# Patient Record
Sex: Male | Born: 2005 | Race: White | Hispanic: No | Marital: Single | Smoking: Never smoker
Health system: Southern US, Community
[De-identification: ages and names within clinical notes are randomized; demographics above are authoritative.]

## PROBLEM LIST (undated history)

## (undated) DIAGNOSIS — M419 Scoliosis, unspecified: Secondary | ICD-10-CM

## (undated) DIAGNOSIS — J45909 Unspecified asthma, uncomplicated: Secondary | ICD-10-CM

---
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
## 2024-05-30 ENCOUNTER — Ambulatory Visit: Admitting: Family Medicine
